# Patient Record
Sex: Female | Born: 1985 | Race: Black or African American | Hispanic: No | Marital: Single | Smoking: Never smoker
Health system: Southern US, Community
[De-identification: ages and names within clinical notes are randomized; demographics above are authoritative.]

---
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
## 2006-03-02 ENCOUNTER — Emergency Department: Payer: Self-pay | Admitting: Emergency Medicine

## 2006-03-05 ENCOUNTER — Emergency Department: Payer: Self-pay

## 2009-03-03 ENCOUNTER — Emergency Department: Payer: Self-pay | Admitting: Emergency Medicine

## 2010-05-01 ENCOUNTER — Emergency Department: Payer: Self-pay | Admitting: Internal Medicine

## 2010-05-07 ENCOUNTER — Emergency Department: Payer: Self-pay | Admitting: Emergency Medicine

## 2010-05-16 ENCOUNTER — Emergency Department: Payer: Self-pay | Admitting: Emergency Medicine

## 2010-05-23 ENCOUNTER — Inpatient Hospital Stay: Payer: Self-pay | Admitting: Obstetrics and Gynecology

## 2010-06-06 ENCOUNTER — Observation Stay: Payer: Self-pay

## 2010-06-12 ENCOUNTER — Inpatient Hospital Stay: Payer: Self-pay

## 2010-11-11 ENCOUNTER — Observation Stay: Payer: Self-pay

## 2010-11-30 ENCOUNTER — Observation Stay: Payer: Self-pay | Admitting: Obstetrics and Gynecology

## 2010-12-06 ENCOUNTER — Inpatient Hospital Stay: Payer: Self-pay | Admitting: Obstetrics and Gynecology

## 2016-06-17 ENCOUNTER — Encounter: Payer: Self-pay | Admitting: Emergency Medicine

## 2016-06-17 ENCOUNTER — Emergency Department
Admission: EM | Admit: 2016-06-17 | Discharge: 2016-06-17 | Disposition: A | Discharge status: Home / Self Care | Payer: Commercial Managed Care - PPO | Attending: Student in an Organized Health Care Education/Training Program | Admitting: Student in an Organized Health Care Education/Training Program

## 2016-06-17 ENCOUNTER — Emergency Department: Payer: Commercial Managed Care - PPO

## 2016-06-17 DIAGNOSIS — R0602 Shortness of breath: Secondary | ICD-10-CM | POA: Insufficient documentation

## 2016-06-17 DIAGNOSIS — R079 Chest pain, unspecified: Secondary | ICD-10-CM | POA: Insufficient documentation

## 2016-06-17 LAB — BASIC METABOLIC PANEL
Anion gap: 7 (ref 5–15)
BUN: 12 mg/dL (ref 6–20)
CHLORIDE: 105 mmol/L (ref 101–111)
CO2: 27 mmol/L (ref 22–32)
CREATININE: 0.96 mg/dL (ref 0.44–1.00)
Calcium: 9.5 mg/dL (ref 8.9–10.3)
Glucose, Bld: 89 mg/dL (ref 65–99)
POTASSIUM: 3.9 mmol/L (ref 3.5–5.1)
SODIUM: 139 mmol/L (ref 135–145)

## 2016-06-17 LAB — CBC
HCT: 38.8 % (ref 35.0–47.0)
Hemoglobin: 13.3 g/dL (ref 12.0–16.0)
MCH: 31 pg (ref 26.0–34.0)
MCHC: 34.4 g/dL (ref 32.0–36.0)
MCV: 90.1 fL (ref 80.0–100.0)
PLATELETS: 217 10*3/uL (ref 150–440)
RBC: 4.3 MIL/uL (ref 3.80–5.20)
RDW: 12.7 % (ref 11.5–14.5)
WBC: 8.4 10*3/uL (ref 3.6–11.0)

## 2016-06-17 LAB — TROPONIN I: Troponin I: <0.03 ng/mL (ref <0.03)

## 2016-06-17 LAB — FIBRIN DERIVATIVES D-DIMER (ARMC ONLY)

## 2016-06-17 NOTE — ED Notes (Signed)
Signature pad did not capture signature.  Discharge instructions reviewed.  Patient verbalized understanding and has no further questions.

## 2016-06-17 NOTE — ED Triage Notes (Signed)
Pt reports left sided chest pain x3 days that radiates to her neck, reports some intermittent shortness of breath.

## 2016-06-17 NOTE — ED Provider Notes (Signed)
[facility] Emergency Department Provider Note    None    (approximate)  I have reviewed the triage vital signs and the nursing notes.   HISTORY  Chief Complaint Chest Pain and Shortness of Breath    HPI Jessica [name] is a 31 y.o. female who presents with left-sided chest pain that radiates up into her neck with achiness in her left chest wall. States that this has been associated with some intermittent shortness of breath. States that she woke up this morning with persistent discomfort and became concerned so she came to the ER for further evaluation. She is on appetite suppressant medication and is on birth control. States that she does feel that she's getting palpitations. No recent fevers. No cough. Was recently sick with GI bug. No change in the pain with positioning. No orthopnea. No lower extremity swelling.   History reviewed. No pertinent past medical history. No family history on file. No past surgical history on file. There are no active problems to display for this patient.     Prior to Admission medications   Not on File    Allergies Patient has no known allergies.    Social History Social History  Substance Use Topics  . Smoking status: Not on file  . Smokeless tobacco: Not on file  . Alcohol use Not on file    Review of Systems Patient denies headaches, rhinorrhea, blurry vision, numbness, shortness of breath, chest pain, edema, cough, abdominal pain, nausea, vomiting, diarrhea, dysuria, fevers, rashes or hallucinations unless otherwise stated above in HPI. ____________________________________________   PHYSICAL EXAM:  VITAL SIGNS: Vitals:   06/17/16 1126 06/17/16 1522  BP: (!) 141/88 124/82  Pulse: 96 74  Resp: 15 18  Temp: 98.4 F (36.9 C)     Constitutional: Alert and oriented. Well appearing and in no acute distress. Eyes: Conjunctivae are normal. PERRL. EOMI. Head: Atraumatic. Nose: No  congestion/rhinnorhea. Mouth/Throat: Mucous membranes are moist.  Oropharynx non-erythematous. Neck: No stridor. Painless ROM. No cervical spine tenderness to palpation Hematological/Lymphatic/Immunilogical: No cervical lymphadenopathy. Cardiovascular: Normal rate, regular rhythm. Grossly normal heart sounds.  Good peripheral circulation. Respiratory: Normal respiratory effort.  No retractions. Lungs CTAB. Gastrointestinal: Soft and nontender. No distention. No abdominal bruits. No CVA tenderness. Genitourinary:  Musculoskeletal: No lower extremity tenderness nor edema.  No joint effusions. Neurologic:  Normal speech and language. No gross focal neurologic deficits are appreciated. No gait instability. Skin:  Skin is warm, dry and intact. No rash noted. Psychiatric: Mood and affect are normal. Speech and behavior are normal.  ____________________________________________   LABS (all labs ordered are listed, but only abnormal results are displayed)  Results for orders placed or performed during the hospital encounter of 06/17/16 (from the past 24 hour(s))  Basic metabolic panel     Status: None   Collection Time: 06/17/16 11:22 AM  Result Value Ref Range   Sodium 139 135 - 145 mmol/L   Potassium 3.9 3.5 - 5.1 mmol/L   Chloride 105 101 - 111 mmol/L   CO2 27 22 - 32 mmol/L   Glucose, Bld 89 65 - 99 mg/dL   BUN 12 6 - 20 mg/dL   Creatinine, Ser 1.61 0.44 - 1.00 mg/dL   Calcium 9.5 8.9 - 09.6 mg/dL   GFR calc non Af Amer >60 >60 mL/min   GFR calc Af Amer >60 >60 mL/min   Anion gap 7 5 - 15  CBC     Status: None   Collection Time:  06/17/16 11:22 AM  Result Value Ref Range   WBC 8.4 3.6 - 11.0 K/uL   RBC 4.30 3.80 - 5.20 MIL/uL   Hemoglobin 13.3 12.0 - 16.0 g/dL   HCT 32.4 40.1 - 02.7 %   MCV 90.1 80.0 - 100.0 fL   MCH 31.0 26.0 - 34.0 pg   MCHC 34.4 32.0 - 36.0 g/dL   RDW 25.3 66.4 - 40.3 %   Platelets 217 150 - 440 K/uL  Troponin I     Status: None   Collection Time: 06/17/16  11:22 AM  Result Value Ref Range   Troponin I <0.03 <0.03 ng/mL  Troponin I     Status: None   Collection Time: 06/17/16  3:01 PM  Result Value Ref Range   Troponin I <0.03 <0.03 ng/mL  Fibrin derivatives D-Dimer ([facility] only)     Status: None   Collection Time: 06/17/16  3:01 PM  Result Value Ref Range   Fibrin derivatives D-dimer ([facility]) <45.00 0.00 - 499.00   ____________________________________________  EKG My review and personal interpretation at Time: 11:22   Indication: chest pain  Rate: 95  Rhythm: sinus Axis: normal Other: normal intervals, no st elevations or depressions ____________________________________________  RADIOLOGY  I personally reviewed all radiographic images ordered to evaluate for the above acute complaints and reviewed radiology reports and findings.  These findings were personally discussed with the patient.  Please see medical record for radiology report.  ____________________________________________   PROCEDURES  Procedure(s) performed:  Procedures    Critical Care performed: no ____________________________________________   INITIAL IMPRESSION / ASSESSMENT AND PLAN / ED COURSE  Pertinent labs & imaging results that were available during my care of the patient were reviewed by me and considered in my medical decision making (see chart for details).  DDX: ACS, pericarditis, bronchitis, costochondritis, pna, Pe, dissection   Jessica [name] is a 31 y.o. who presents to the ED with chest pain as described above.  Patient is AFVSS in ED. Exam as above. Given current presentation have considered the above differential.  Patient is low risk by well's for further risk stratify for PE with d-dimer. EKG shows no dysrhythmia or acute ischemia. Given recent viral illness could have a component of pericarditis but clinically does not appear so. Chest x-ray shows no evidence of consolidation or pneumothorax. No clinical evidence to suggest dissection or aneurysm  and this is a well-appearing young patient with no significant comorbidities.  The patient will be placed on continuous pulse oximetry and telemetry for monitoring.  Laboratory evaluation will be sent to evaluate for the above complaints.      Clinical Course as of Jun 17 1553  Mon Jun 17, 2016  1552 Repeat troponin is negative. D-dimer is negative. Do suspect some component of costochondritis or pleurisy. Patient well-appearing and in no acute distress. Patient stable for follow-up with her PCP.  Have discussed with the patient and available family all diagnostics and treatments performed thus far and all questions were answered to the best of my ability. The patient demonstrates understanding and agreement with plan.   [PR]    Clinical Course User Index [PR] Willy Eddy, MD     ____________________________________________   FINAL CLINICAL IMPRESSION(S) / ED DIAGNOSES  Final diagnoses:  Chest pain, unspecified type      NEW MEDICATIONS STARTED DURING THIS VISIT:  New Prescriptions   No medications on file     Note:  This document was prepared using Dragon voice recognition software and  may include unintentional dictation errors.    Willy Eddyobinson, Afrah Burlison, MD 06/17/16 [id number]

## 2017-01-21 ENCOUNTER — Encounter (HOSPITAL_COMMUNITY)
Admission: EM | Disposition: A | Discharge status: Home Health | Payer: Self-pay | Source: Home / Self Care | Attending: Orthopedic Surgery

## 2017-01-21 ENCOUNTER — Other Ambulatory Visit: Payer: Self-pay

## 2017-01-21 ENCOUNTER — Inpatient Hospital Stay (HOSPITAL_COMMUNITY): Payer: Commercial Managed Care - PPO | Admitting: Certified Registered Nurse Anesthetist

## 2017-01-21 ENCOUNTER — Inpatient Hospital Stay (HOSPITAL_COMMUNITY)
Admission: EM | Admit: 2017-01-21 | Discharge: 2017-01-24 | DRG: 494 | Disposition: A | Discharge status: Home Health | Payer: Commercial Managed Care - PPO | Attending: Orthopedic Surgery | Admitting: Orthopedic Surgery

## 2017-01-21 ENCOUNTER — Inpatient Hospital Stay (HOSPITAL_COMMUNITY): Payer: Commercial Managed Care - PPO

## 2017-01-21 ENCOUNTER — Encounter (HOSPITAL_COMMUNITY): Payer: Self-pay | Admitting: Emergency Medicine

## 2017-01-21 ENCOUNTER — Emergency Department (HOSPITAL_COMMUNITY): Payer: Commercial Managed Care - PPO

## 2017-01-21 DIAGNOSIS — M79642 Pain in left hand: Secondary | ICD-10-CM | POA: Diagnosis present

## 2017-01-21 DIAGNOSIS — S82251A Displaced comminuted fracture of shaft of right tibia, initial encounter for closed fracture: Secondary | ICD-10-CM | POA: Diagnosis present

## 2017-01-21 DIAGNOSIS — S82401A Unspecified fracture of shaft of right fibula, initial encounter for closed fracture: Secondary | ICD-10-CM | POA: Diagnosis not present

## 2017-01-21 DIAGNOSIS — M25552 Pain in left hip: Secondary | ICD-10-CM | POA: Diagnosis present

## 2017-01-21 DIAGNOSIS — S42022D Displaced fracture of shaft of left clavicle, subsequent encounter for fracture with routine healing: Secondary | ICD-10-CM | POA: Diagnosis not present

## 2017-01-21 DIAGNOSIS — Z419 Encounter for procedure for purposes other than remedying health state, unspecified: Secondary | ICD-10-CM

## 2017-01-21 DIAGNOSIS — R52 Pain, unspecified: Secondary | ICD-10-CM

## 2017-01-21 DIAGNOSIS — S82251D Displaced comminuted fracture of shaft of right tibia, subsequent encounter for closed fracture with routine healing: Secondary | ICD-10-CM | POA: Diagnosis not present

## 2017-01-21 DIAGNOSIS — Z23 Encounter for immunization: Secondary | ICD-10-CM | POA: Diagnosis not present

## 2017-01-21 DIAGNOSIS — Y92410 Unspecified street and highway as the place of occurrence of the external cause: Secondary | ICD-10-CM

## 2017-01-21 DIAGNOSIS — S42022A Displaced fracture of shaft of left clavicle, initial encounter for closed fracture: Secondary | ICD-10-CM | POA: Diagnosis present

## 2017-01-21 DIAGNOSIS — S42002A Fracture of unspecified part of left clavicle, initial encounter for closed fracture: Secondary | ICD-10-CM | POA: Diagnosis not present

## 2017-01-21 DIAGNOSIS — S82451A Displaced comminuted fracture of shaft of right fibula, initial encounter for closed fracture: Secondary | ICD-10-CM | POA: Diagnosis present

## 2017-01-21 DIAGNOSIS — S82201A Unspecified fracture of shaft of right tibia, initial encounter for closed fracture: Secondary | ICD-10-CM

## 2017-01-21 HISTORY — PX: IM NAILING TIBIA: SUR734

## 2017-01-21 HISTORY — PX: TIBIA IM NAIL INSERTION: SHX2516

## 2017-01-21 HISTORY — PX: ORIF CLAVICLE FRACTURE: SUR924

## 2017-01-21 HISTORY — PX: ORIF CLAVICULAR FRACTURE: SHX5055

## 2017-01-21 LAB — COMPREHENSIVE METABOLIC PANEL
ALT: 16 U/L (ref 14–54)
AST: 24 U/L (ref 15–41)
Albumin: 4 g/dL (ref 3.5–5.0)
Alkaline Phosphatase: 54 U/L (ref 38–126)
Anion gap: 9 (ref 5–15)
BILIRUBIN TOTAL: 0.8 mg/dL (ref 0.3–1.2)
BUN: 11 mg/dL (ref 6–20)
CHLORIDE: 106 mmol/L (ref 101–111)
CO2: 24 mmol/L (ref 22–32)
CREATININE: 0.8 mg/dL (ref 0.44–1.00)
Calcium: 8.9 mg/dL (ref 8.9–10.3)
GFR calc Af Amer: >60 mL/min (ref >60)
GFR calc non Af Amer: >60 mL/min (ref >60)
GLUCOSE: 125 mg/dL — AB (ref 65–99)
Potassium: 3 mmol/L — ABNORMAL LOW (ref 3.5–5.1)
Sodium: 139 mmol/L (ref 135–145)
Total Protein: 7 g/dL (ref 6.5–8.1)

## 2017-01-21 LAB — RAPID URINE DRUG SCREEN, HOSP PERFORMED
Amphetamines: NOT DETECTED
BARBITURATES: NOT DETECTED
Benzodiazepines: NOT DETECTED
Cocaine: NOT DETECTED
Opiates: NOT DETECTED
Tetrahydrocannabinol: NOT DETECTED

## 2017-01-21 LAB — I-STAT BETA HCG BLOOD, ED (MC, WL, AP ONLY): I-stat hCG, quantitative: <5 m[IU]/mL (ref <5)

## 2017-01-21 LAB — CBC WITH DIFFERENTIAL/PLATELET
BASOS ABS: 0 10*3/uL (ref 0.0–0.1)
Basophils Relative: 0 %
Eosinophils Absolute: 0.1 10*3/uL (ref 0.0–0.7)
Eosinophils Relative: 1 %
HEMATOCRIT: 37.6 % (ref 36.0–46.0)
Hemoglobin: 12.9 g/dL (ref 12.0–15.0)
LYMPHS PCT: 19 %
Lymphs Abs: 2.9 10*3/uL (ref 0.7–4.0)
MCH: 31.2 pg (ref 26.0–34.0)
MCHC: 34.3 g/dL (ref 30.0–36.0)
MCV: 90.8 fL (ref 78.0–100.0)
Monocytes Absolute: 0.8 10*3/uL (ref 0.1–1.0)
Monocytes Relative: 5 %
NEUTROS ABS: 11.5 10*3/uL — AB (ref 1.7–7.7)
Neutrophils Relative %: 75 %
PLATELETS: 261 10*3/uL (ref 150–400)
RBC: 4.14 MIL/uL (ref 3.87–5.11)
RDW: 13.3 % (ref 11.5–15.5)
WBC: 15.2 10*3/uL — AB (ref 4.0–10.5)

## 2017-01-21 LAB — SURGICAL PCR SCREEN
MRSA, PCR: NEGATIVE
Staphylococcus aureus: NEGATIVE

## 2017-01-21 SURGERY — OPEN REDUCTION INTERNAL FIXATION (ORIF) CLAVICULAR FRACTURE
Anesthesia: General | Laterality: Right

## 2017-01-21 MED ORDER — METOCLOPRAMIDE HCL 5 MG PO TABS: 5.0000 mg | ORAL_TABLET | Freq: Three times a day (TID) | ORAL | Status: DC | PRN

## 2017-01-21 MED ORDER — PROMETHAZINE HCL 25 MG/ML IJ SOLN: 6.2500 mg | INTRAMUSCULAR | Status: DC | PRN

## 2017-01-21 MED ORDER — HYDROMORPHONE HCL 1 MG/ML IJ SOLN
INTRAMUSCULAR | Status: AC
  Filled 2017-01-21: qty 1

## 2017-01-21 MED ORDER — 0.9 % SODIUM CHLORIDE (POUR BTL) OPTIME
TOPICAL | Status: DC | PRN
  Administered 2017-01-21: 1000 mL

## 2017-01-21 MED ORDER — FENTANYL CITRATE (PF) 100 MCG/2ML IJ SOLN
100.0000 ug | Freq: Once | INTRAMUSCULAR | Status: AC
  Administered 2017-01-21: 100 ug via INTRAVENOUS

## 2017-01-21 MED ORDER — SODIUM CHLORIDE 0.9 % IV SOLN
INTRAVENOUS | Status: AC
  Administered 2017-01-21: 03:00:00 via INTRAVENOUS

## 2017-01-21 MED ORDER — ONDANSETRON HCL 4 MG PO TABS: 4.0000 mg | ORAL_TABLET | Freq: Four times a day (QID) | ORAL | Status: DC | PRN

## 2017-01-21 MED ORDER — DEXTROSE 5 % IV SOLN
INTRAVENOUS | Status: DC | PRN
  Administered 2017-01-21: 3 g via INTRAVENOUS

## 2017-01-21 MED ORDER — MIDAZOLAM HCL 2 MG/2ML IJ SOLN
INTRAMUSCULAR | Status: AC
  Filled 2017-01-21: qty 2

## 2017-01-21 MED ORDER — FENTANYL CITRATE (PF) 250 MCG/5ML IJ SOLN
INTRAMUSCULAR | Status: AC
Start: 2017-01-21 — End: 2017-01-21
  Filled 2017-01-21: qty 5

## 2017-01-21 MED ORDER — POLYETHYLENE GLYCOL 3350 17 G PO PACK: 17.0000 g | PACK | Freq: Every day | ORAL | Status: DC | PRN

## 2017-01-21 MED ORDER — DEXAMETHASONE SODIUM PHOSPHATE 10 MG/ML IJ SOLN
INTRAMUSCULAR | Status: AC
  Filled 2017-01-21: qty 1

## 2017-01-21 MED ORDER — FENTANYL CITRATE (PF) 100 MCG/2ML IJ SOLN
50.0000 ug | Freq: Once | INTRAMUSCULAR | Status: AC
  Administered 2017-01-21: 50 ug via INTRAVENOUS

## 2017-01-21 MED ORDER — METOCLOPRAMIDE HCL 5 MG/ML IJ SOLN: 5.0000 mg | Freq: Three times a day (TID) | INTRAMUSCULAR | Status: DC | PRN

## 2017-01-21 MED ORDER — ONDANSETRON HCL 4 MG/2ML IJ SOLN: 4.0000 mg | Freq: Three times a day (TID) | INTRAMUSCULAR | Status: DC | PRN

## 2017-01-21 MED ORDER — LACTATED RINGERS IV SOLN
INTRAVENOUS | Status: DC | PRN
  Administered 2017-01-21 (×2): via INTRAVENOUS

## 2017-01-21 MED ORDER — FENTANYL CITRATE (PF) 100 MCG/2ML IJ SOLN
50.0000 ug | Freq: Once | INTRAMUSCULAR | Status: AC
  Administered 2017-01-21: 50 ug via INTRAVENOUS
  Filled 2017-01-21: qty 2

## 2017-01-21 MED ORDER — DEXAMETHASONE SODIUM PHOSPHATE 10 MG/ML IJ SOLN
INTRAMUSCULAR | Status: DC | PRN
  Administered 2017-01-21: 10 mg via INTRAVENOUS

## 2017-01-21 MED ORDER — FENTANYL CITRATE (PF) 100 MCG/2ML IJ SOLN
50.0000 ug | INTRAMUSCULAR | Status: DC | PRN
  Administered 2017-01-21 (×4): 50 ug via INTRAVENOUS
  Filled 2017-01-21 (×4): qty 2

## 2017-01-21 MED ORDER — CHLORHEXIDINE GLUCONATE 4 % EX LIQD: 60.0000 mL | Freq: Once | CUTANEOUS | Status: DC

## 2017-01-21 MED ORDER — PHENYLEPHRINE HCL 10 MG/ML IJ SOLN
INTRAMUSCULAR | Status: DC | PRN
  Administered 2017-01-21 (×3): 80 ug via INTRAVENOUS

## 2017-01-21 MED ORDER — ACETAMINOPHEN 325 MG PO TABS
650.0000 mg | ORAL_TABLET | ORAL | Status: DC | PRN
  Administered 2017-01-21 – 2017-01-22 (×2): 650 mg via ORAL
  Filled 2017-01-21 (×2): qty 2

## 2017-01-21 MED ORDER — DOCUSATE SODIUM 100 MG PO CAPS
100.0000 mg | ORAL_CAPSULE | Freq: Two times a day (BID) | ORAL | Status: DC
  Administered 2017-01-21 – 2017-01-24 (×6): 100 mg via ORAL
  Filled 2017-01-21 (×6): qty 1

## 2017-01-21 MED ORDER — HYDROMORPHONE HCL 1 MG/ML IJ SOLN
1.0000 mg | INTRAMUSCULAR | Status: DC | PRN
  Administered 2017-01-21 (×2): 1 mg via INTRAVENOUS
  Filled 2017-01-21 (×2): qty 1

## 2017-01-21 MED ORDER — MAGNESIUM CITRATE PO SOLN: 1.0000 | Freq: Once | ORAL | Status: DC | PRN

## 2017-01-21 MED ORDER — CEFAZOLIN SODIUM-DEXTROSE 2-4 GM/100ML-% IV SOLN
2.0000 g | Freq: Four times a day (QID) | INTRAVENOUS | Status: AC
  Administered 2017-01-21 – 2017-01-22 (×3): 2 g via INTRAVENOUS
  Filled 2017-01-21 (×3): qty 100

## 2017-01-21 MED ORDER — METHOCARBAMOL 1000 MG/10ML IJ SOLN
500.0000 mg | Freq: Four times a day (QID) | INTRAMUSCULAR | Status: DC | PRN
  Filled 2017-01-21: qty 5

## 2017-01-21 MED ORDER — ASPIRIN EC 325 MG PO TBEC
325.0000 mg | DELAYED_RELEASE_TABLET | Freq: Every day | ORAL | Status: DC
  Administered 2017-01-22 – 2017-01-24 (×3): 325 mg via ORAL
  Filled 2017-01-21 (×3): qty 1

## 2017-01-21 MED ORDER — SUGAMMADEX SODIUM 200 MG/2ML IV SOLN
INTRAVENOUS | Status: DC | PRN
  Administered 2017-01-21: 175 mg via INTRAVENOUS

## 2017-01-21 MED ORDER — OXYCODONE HCL 5 MG PO TABS
5.0000 mg | ORAL_TABLET | ORAL | Status: DC | PRN
  Administered 2017-01-23: 5 mg via ORAL
  Filled 2017-01-21: qty 1

## 2017-01-21 MED ORDER — ROCURONIUM BROMIDE 10 MG/ML (PF) SYRINGE
PREFILLED_SYRINGE | INTRAVENOUS | Status: AC
Start: 2017-01-21 — End: 2017-01-21
  Filled 2017-01-21: qty 5

## 2017-01-21 MED ORDER — TETANUS-DIPHTH-ACELL PERTUSSIS 5-2.5-18.5 LF-MCG/0.5 IM SUSP
INTRAMUSCULAR | Status: AC
Start: 2017-01-21 — End: 2017-01-21
  Filled 2017-01-21: qty 0.5

## 2017-01-21 MED ORDER — ROCURONIUM BROMIDE 100 MG/10ML IV SOLN
INTRAVENOUS | Status: DC | PRN
  Administered 2017-01-21: 50 mg via INTRAVENOUS

## 2017-01-21 MED ORDER — FENTANYL CITRATE (PF) 100 MCG/2ML IJ SOLN
INTRAMUSCULAR | Status: AC
  Filled 2017-01-21: qty 2

## 2017-01-21 MED ORDER — OXYCODONE HCL 5 MG PO TABS
10.0000 mg | ORAL_TABLET | ORAL | Status: DC | PRN
  Administered 2017-01-21 – 2017-01-24 (×10): 10 mg via ORAL
  Filled 2017-01-21 (×11): qty 2

## 2017-01-21 MED ORDER — TETANUS-DIPHTH-ACELL PERTUSSIS 5-2.5-18.5 LF-MCG/0.5 IM SUSP
0.5000 mL | Freq: Once | INTRAMUSCULAR | Status: AC
  Administered 2017-01-21: 0.5 mL via INTRAMUSCULAR

## 2017-01-21 MED ORDER — CEFAZOLIN SODIUM-DEXTROSE 2-4 GM/100ML-% IV SOLN: 2.0000 g | INTRAVENOUS | Status: DC

## 2017-01-21 MED ORDER — ONDANSETRON HCL 4 MG/2ML IJ SOLN
4.0000 mg | Freq: Four times a day (QID) | INTRAMUSCULAR | Status: DC | PRN
  Administered 2017-01-21: 4 mg via INTRAVENOUS
  Filled 2017-01-21: qty 2

## 2017-01-21 MED ORDER — SODIUM CHLORIDE 0.9 % IV SOLN
INTRAVENOUS | Status: DC
  Administered 2017-01-21: 17:00:00 via INTRAVENOUS

## 2017-01-21 MED ORDER — POVIDONE-IODINE 10 % EX SWAB: 2.0000 "application " | Freq: Once | CUTANEOUS | Status: DC

## 2017-01-21 MED ORDER — BISACODYL 10 MG RE SUPP: 10.0000 mg | Freq: Every day | RECTAL | Status: DC | PRN

## 2017-01-21 MED ORDER — LIDOCAINE 2% (20 MG/ML) 5 ML SYRINGE
INTRAMUSCULAR | Status: AC
  Filled 2017-01-21: qty 5

## 2017-01-21 MED ORDER — ACETAMINOPHEN 650 MG RE SUPP: 650.0000 mg | RECTAL | Status: DC | PRN

## 2017-01-21 MED ORDER — LIDOCAINE HCL (CARDIAC) 20 MG/ML IV SOLN
INTRAVENOUS | Status: DC | PRN
  Administered 2017-01-21: 50 mg via INTRAVENOUS

## 2017-01-21 MED ORDER — SODIUM CHLORIDE 0.9 % IV BOLUS (SEPSIS)
1000.0000 mL | Freq: Once | INTRAVENOUS | Status: AC
  Administered 2017-01-21: 1000 mL via INTRAVENOUS

## 2017-01-21 MED ORDER — MIDAZOLAM HCL 5 MG/5ML IJ SOLN
INTRAMUSCULAR | Status: DC | PRN
  Administered 2017-01-21: 1 mg via INTRAVENOUS

## 2017-01-21 MED ORDER — HYDROMORPHONE HCL 1 MG/ML IJ SOLN
0.2500 mg | INTRAMUSCULAR | Status: DC | PRN
  Administered 2017-01-21 (×2): 0.5 mg via INTRAVENOUS

## 2017-01-21 MED ORDER — SUGAMMADEX SODIUM 200 MG/2ML IV SOLN
INTRAVENOUS | Status: AC
  Filled 2017-01-21: qty 2

## 2017-01-21 MED ORDER — PROPOFOL 10 MG/ML IV BOLUS
INTRAVENOUS | Status: DC | PRN
  Administered 2017-01-21: 100 mg via INTRAVENOUS

## 2017-01-21 MED ORDER — METHOCARBAMOL 500 MG PO TABS
500.0000 mg | ORAL_TABLET | Freq: Four times a day (QID) | ORAL | Status: DC | PRN
  Administered 2017-01-21 – 2017-01-23 (×4): 500 mg via ORAL
  Filled 2017-01-21 (×4): qty 1

## 2017-01-21 MED ORDER — FENTANYL CITRATE (PF) 100 MCG/2ML IJ SOLN
INTRAMUSCULAR | Status: DC | PRN
  Administered 2017-01-21: 50 ug via INTRAVENOUS
  Administered 2017-01-21 (×2): 100 ug via INTRAVENOUS

## 2017-01-21 MED ORDER — ONDANSETRON HCL 4 MG/2ML IJ SOLN
INTRAMUSCULAR | Status: AC
  Filled 2017-01-21: qty 2

## 2017-01-21 MED ORDER — PHENYLEPHRINE 40 MCG/ML (10ML) SYRINGE FOR IV PUSH (FOR BLOOD PRESSURE SUPPORT)
PREFILLED_SYRINGE | INTRAVENOUS | Status: AC
  Filled 2017-01-21: qty 10

## 2017-01-21 SURGICAL SUPPLY — 63 items
BANDAGE ACE 6X5 VEL STRL LF (GAUZE/BANDAGES/DRESSINGS) ×4 IMPLANT
BIT DRILL 2.5X110 QC LCP DISP (BIT) ×4 IMPLANT
BIT DRILL CALIBRATED 4.2 (BIT) ×2 IMPLANT
BIT DRILL SHORT 4.2 (BIT) ×2 IMPLANT
BLADE SURG 10 STRL SS (BLADE) ×4 IMPLANT
BNDG COHESIVE 4X5 TAN STRL (GAUZE/BANDAGES/DRESSINGS) ×4 IMPLANT
BNDG COHESIVE 6X5 TAN STRL LF (GAUZE/BANDAGES/DRESSINGS) ×4 IMPLANT
BNDG GAUZE ELAST 4 BULKY (GAUZE/BANDAGES/DRESSINGS) ×4 IMPLANT
CLOSURE WOUND 1/4X4 (GAUZE/BANDAGES/DRESSINGS) ×1
COVER SURGICAL LIGHT HANDLE (MISCELLANEOUS) ×4 IMPLANT
DRAPE C-ARM MINI 42X72 WSTRAPS (DRAPES) ×4 IMPLANT
DRAPE IMP U-DRAPE 54X76 (DRAPES) ×4 IMPLANT
DRAPE ORTHO SPLIT 77X108 STRL (DRAPES) ×2
DRAPE SURG ORHT 6 SPLT 77X108 (DRAPES) ×2 IMPLANT
DRAPE U-SHAPE 47X51 STRL (DRAPES) ×8 IMPLANT
DRILL BIT CALIBRATED 4.2 (BIT) ×4
DRILL BIT SHORT 4.2 (BIT) ×2
DRSG ADAPTIC 3X8 NADH LF (GAUZE/BANDAGES/DRESSINGS) ×4 IMPLANT
DRSG MEPILEX BORDER 4X4 (GAUZE/BANDAGES/DRESSINGS) ×16 IMPLANT
DRSG MEPILEX BORDER 4X8 (GAUZE/BANDAGES/DRESSINGS) ×4 IMPLANT
DRSG PAD ABDOMINAL 8X10 ST (GAUZE/BANDAGES/DRESSINGS) ×8 IMPLANT
DURAPREP 26ML APPLICATOR (WOUND CARE) ×8 IMPLANT
ELECT CAUTERY BLADE 6.4 (BLADE) ×4 IMPLANT
ELECT REM PT RETURN 9FT ADLT (ELECTROSURGICAL) ×4
ELECTRODE REM PT RTRN 9FT ADLT (ELECTROSURGICAL) ×2 IMPLANT
GAUZE SPONGE 4X4 12PLY STRL (GAUZE/BANDAGES/DRESSINGS) ×4 IMPLANT
GLOVE BIO SURGEON ST LM GN SZ9 (GLOVE) ×8 IMPLANT
GLOVE BIO SURGEON STRL SZ8 (GLOVE) ×4 IMPLANT
GLOVE BIOGEL PI IND STRL 9 (GLOVE) ×2 IMPLANT
GLOVE BIOGEL PI INDICATOR 9 (GLOVE) ×2
GLOVE SURG ORTHO 9.0 STRL STRW (GLOVE) ×4 IMPLANT
GOWN STRL REUS W/ TWL XL LVL3 (GOWN DISPOSABLE) ×6 IMPLANT
GOWN STRL REUS W/TWL XL LVL3 (GOWN DISPOSABLE) ×6
GUIDEWIRE 3.2X400 (WIRE) ×4 IMPLANT
KIT BASIN OR (CUSTOM PROCEDURE TRAY) ×4 IMPLANT
KIT ROOM TURNOVER OR (KITS) ×4 IMPLANT
NAIL TIBIAL CANN 10MM PROX 330 (Nail) ×4 IMPLANT
PACK ORTHO EXTREMITY (CUSTOM PROCEDURE TRAY) ×4 IMPLANT
PACK SHOULDER (CUSTOM PROCEDURE TRAY) ×4 IMPLANT
PACK UNIVERSAL I (CUSTOM PROCEDURE TRAY) ×4 IMPLANT
PAD ARMBOARD 7.5X6 YLW CONV (MISCELLANEOUS) ×8 IMPLANT
PENCIL BUTTON HOLSTER BLD 10FT (ELECTRODE) ×4 IMPLANT
PLATE CLAVICLE 3.5X85 6H LFT (Plate) ×4 IMPLANT
REAMER ROD DEEP FLUTE 2.5X950 (INSTRUMENTS) ×4 IMPLANT
SCREW CORTEX 3.5 14MM (Screw) ×8 IMPLANT
SCREW LOCK CORT ST 3.5X14 (Screw) ×8 IMPLANT
SCREW LOCK STAR 5X28 (Screw) ×8 IMPLANT
SCREW LOCK STAR 5X50 (Screw) ×4 IMPLANT
SPONGE LAP 18X18 X RAY DECT (DISPOSABLE) ×4 IMPLANT
STAPLER VISISTAT 35W (STAPLE) ×4 IMPLANT
STOCKINETTE IMPERVIOUS LG (DRAPES) ×4 IMPLANT
STRIP CLOSURE SKIN 1/4X4 (GAUZE/BANDAGES/DRESSINGS) ×3 IMPLANT
SUCTION FRAZIER HANDLE 10FR (MISCELLANEOUS) ×2
SUCTION TUBE FRAZIER 10FR DISP (MISCELLANEOUS) ×2 IMPLANT
SUT VIC AB 0 CTB1 27 (SUTURE) ×4 IMPLANT
SUT VIC AB 2-0 CT1 27 (SUTURE) ×2
SUT VIC AB 2-0 CT1 TAPERPNT 27 (SUTURE) ×2 IMPLANT
SUT VIC AB 2-0 CTB1 (SUTURE) ×4 IMPLANT
SYR BULB IRRIGATION 50ML (SYRINGE) ×4 IMPLANT
TOWEL OR 17X24 6PK STRL BLUE (TOWEL DISPOSABLE) ×4 IMPLANT
TOWEL OR 17X26 10 PK STRL BLUE (TOWEL DISPOSABLE) ×4 IMPLANT
TUBE CONNECTING 12'X1/4 (SUCTIONS) ×1
TUBE CONNECTING 12X1/4 (SUCTIONS) ×3 IMPLANT

## 2017-01-21 NOTE — Op Note (Signed)
01/21/2017  12:02 PM  PATIENT:  Jessica [name]    PRE-OPERATIVE DIAGNOSIS:  Fracture Right Tibia and Left Clavicle closed.  POST-OPERATIVE DIAGNOSIS:  Same  PROCEDURE:  OPEN REDUCTION INTERNAL FIXATION (ORIF) CLAVICULAR FRACTURE, INTRAMEDULLARY (IM) NAIL TIBIAL  SURGEON:  Nadara MustardMarcus V Duda, MD  PHYSICIAN ASSISTANT:None ANESTHESIA:   General  PREOPERATIVE INDICATIONS:  Jessica [name] is a  32 y.o. female with a diagnosis of Fracture Right Tibia and Left Clavicle  who failed conservative measures and elected for surgical management.    The risks benefits and alternatives were discussed with the patient preoperatively including but not limited to the risks of infection, bleeding, nerve injury, cardiopulmonary complications, the need for revision surgery, among others, and the patient was willing to proceed.  OPERATIVE IMPLANTS: Synthes 10 x 330 mm tibial nail locked proximally x1 distally x2. Synthes 6-hole stainless steel clavicle plate.  OPERATIVE FINDINGS: Stable alignment AP and lateral planes postoperatively.  OPERATIVE PROCEDURE: Patient was brought the operating room and underwent a general anesthetic.  After adequate levels of anesthesia were obtained patient's right lower extremity and left upper extremity were prepped using DuraPrep draped into a sterile field a timeout was called.  A skin incision was made medial and mid patella for the right [name] with the [name] on the 30 degree triangle.  A guidewire was inserted center center into the tibia this was inserted into the bone C-arm fluoroscopy verified alignment this was then overreamed.  A guidewire was then advanced across the segmental segments of the tibia.  This was then reamed sequentially to 11.5 mm for a 10 mm nail.  The nail was inserted.  It was locked transversely medially to laterally proximally and distally using freehand technique and the C-arm fluoroscopy 28 mm screws were placed distally.  The rotation and length were checked.   The wounds were irrigated with normal saline subcu was closed using Vicryl skin was closed using staples Mepilex dressings were applied.  Attention was then focused on the left upper extremity.  An incision was made over the inferior margin of the clavicle.  Dissection was carried down to the dorsum of the clavicle subperiosteal dissection was used to debride the bone the fracture site was cleansed irrigated and the most medial aspect of the plate was secured with a clamp to the clavicle the clavicle was then reduced to the plate laterally.  2 screws were placed medially and 2 screws laterally to stabilize the clavicle fracture.  C-arm fluoroscopy verified alignment.  The wound was irrigated with normal saline subcu was closed using 0 Vicryl the skin was closed using Steri-Strips.  Mepilex dressing was applied patient was extubated taken the PACU in stable condition.   DISCHARGE PLANNING:  Antibiotic duration: 24 hours antibiotics.  Weightbearing: Nonweightbearing right lower extremity and partial weightbearing left upper extremity.  Pain medication: As needed  Dressing care/ Wound VAC: Dry dressing change as needed.  Ambulatory devices: Patient will need a walker and wheelchair.  Depending on her mobility she may require discharge to skilled nursing.  Discharge to: Home possibly rehab.  Follow-up: In the office 1 week post operative.

## 2017-01-21 NOTE — Anesthesia Postprocedure Evaluation (Signed)
Anesthesia Post Note  Patient: Jessica [name]  Procedure(s) Performed: OPEN REDUCTION INTERNAL FIXATION (ORIF) CLAVICULAR FRACTURE (Left ) INTRAMEDULLARY (IM) NAIL TIBIAL (Right )     Patient location during evaluation: PACU Anesthesia Type: General Level of consciousness: awake and alert Pain management: pain level controlled Vital Signs Assessment: post-procedure vital signs reviewed and stable Respiratory status: spontaneous breathing, nonlabored ventilation, respiratory function stable and patient connected to nasal cannula oxygen Cardiovascular status: blood pressure returned to baseline and stable Postop Assessment: no apparent nausea or vomiting Anesthetic complications: no    Last Vitals:  Vitals:   01/21/17 1323 01/21/17 1417  BP:  125/85  Pulse:  94  Resp:  18  Temp: (!) 36.4 C 36.8 C  SpO2:  100%    Last Pain:  Vitals:   01/21/17 1417  TempSrc: Oral  PainSc:                  Evelio Rueda S

## 2017-01-21 NOTE — ED Notes (Signed)
Dr. Ward at bedside.

## 2017-01-21 NOTE — Anesthesia Preprocedure Evaluation (Signed)
Anesthesia Evaluation  Patient identified by MRN, date of birth, ID band Patient awake    Reviewed: Allergy & Precautions, NPO status , Patient's Chart, lab work & pertinent test results  Airway Mallampati: II  TM Distance: >3 FB Neck ROM: Full    Dental no notable dental hx.    Pulmonary neg pulmonary ROS,    Pulmonary exam normal breath sounds clear to auscultation       Cardiovascular negative cardio ROS Normal cardiovascular exam Rhythm:Regular Rate:Normal     Neuro/Psych negative neurological ROS  negative psych ROS   GI/Hepatic negative GI ROS, Neg liver ROS,   Endo/Other  negative endocrine ROS  Renal/GU negative Renal ROS  negative genitourinary   Musculoskeletal negative musculoskeletal ROS (+)   Abdominal   Peds negative pediatric ROS (+)  Hematology negative hematology ROS (+)   Anesthesia Other Findings   Reproductive/Obstetrics negative OB ROS                             Anesthesia Physical Anesthesia Plan  ASA: II and emergent  Anesthesia Plan: General   Post-op Pain Management:    Induction: Intravenous  PONV Risk Score and Plan: 3 and Ondansetron, Dexamethasone, Midazolam and Treatment may vary due to age or medical condition  Airway Management Planned: Oral ETT  Additional Equipment:   Intra-op Plan:   Post-operative Plan: Extubation in OR  Informed Consent: I have reviewed the patients History and Physical, chart, labs and discussed the procedure including the risks, benefits and alternatives for the proposed anesthesia with the patient or authorized representative who has indicated his/her understanding and acceptance.   Dental advisory given  Plan Discussed with: CRNA and Surgeon  Anesthesia Plan Comments:         Anesthesia Quick Evaluation

## 2017-01-21 NOTE — Progress Notes (Signed)
Patient transported to OR at this time.

## 2017-01-21 NOTE — Progress Notes (Signed)
Called to notify Dr. Lajoyce Cornersuda that patient arrived to unit.

## 2017-01-21 NOTE — Transfer of Care (Signed)
Immediate Anesthesia Transfer of Care Note  Patient: Jessica RooseveltShari Y [name]  Procedure(s) Performed: OPEN REDUCTION INTERNAL FIXATION (ORIF) CLAVICULAR FRACTURE (Left ) INTRAMEDULLARY (IM) NAIL TIBIAL (Right )  Patient Location: PACU  Anesthesia Type:General  Level of Consciousness: awake, alert , oriented and sedated  Airway & Oxygen Therapy: Patient Spontanous Breathing and Patient connected to nasal cannula oxygen  Post-op Assessment: Report given to RN, Post -op Vital signs reviewed and stable and Patient moving all extremities  Post vital signs: Reviewed and stable  Last Vitals:  Vitals:   01/21/17 0547 01/21/17 1218  BP: 121/72   Pulse: 98   Resp: 16   Temp: 37.1 C (!) (P) 36.3 C  SpO2: 100%     Last Pain:  Vitals:   01/21/17 1218  TempSrc:   PainSc: (P) Asleep         Complications: No apparent anesthesia complications

## 2017-01-21 NOTE — Progress Notes (Signed)
Orthopedic Tech Progress Note Patient Details:  Jessica RooseveltShari Y [name] 02-20-1985 [id number]  Ortho Devices Type of Ortho Device: CAM walker, Sling immobilizer Ortho Device/Splint Location: lue/rle Ortho Device/Splint Interventions: Application   Post Interventions Patient Tolerated: Well Instructions Provided: Care of device   Nikki DomCrawford, Lazarius Rivkin 01/21/2017, 1:01 PM

## 2017-01-21 NOTE — Plan of Care (Signed)
  Progressing Education: Knowledge of General Education information will improve 01/21/2017 1250 - Progressing by Darreld Mcleanox, Lachele Lievanos, RN Health Behavior/Discharge Planning: Ability to manage health-related needs will improve 01/21/2017 1250 - Progressing by Darreld Mcleanox, Celest Reitz, RN Clinical Measurements: Ability to maintain clinical measurements within normal limits will improve 01/21/2017 1250 - Progressing by Darreld Mcleanox, Ha Placeres, RN Will remain free from infection 01/21/2017 1250 - Progressing by Darreld Mcleanox, Nayra Coury, RN Diagnostic test results will improve 01/21/2017 1250 - Progressing by Darreld Mcleanox, Shianne Zeiser, RN Respiratory complications will improve 01/21/2017 1250 - Progressing by Darreld Mcleanox, Garik Diamant, RN Cardiovascular complication will be avoided 01/21/2017 1250 - Progressing by Darreld Mcleanox, Melaney Tellefsen, RN Activity: Risk for activity intolerance will decrease 01/21/2017 1250 - Progressing by Darreld Mcleanox, Phoenyx Melka, RN Nutrition: Adequate nutrition will be maintained 01/21/2017 1250 - Progressing by Darreld Mcleanox, Fatin Bachicha, RN Coping: Level of anxiety will decrease 01/21/2017 1250 - Progressing by Darreld Mcleanox, Hendryx Ricke, RN Elimination: Will not experience complications related to bowel motility 01/21/2017 1250 - Progressing by Darreld Mcleanox, Lakeita Panther, RN Will not experience complications related to urinary retention 01/21/2017 1250 - Progressing by Darreld Mcleanox, Maliyah Willets, RN Pain Managment: General experience of comfort will improve 01/21/2017 1250 - Progressing by Darreld Mcleanox, Inga Noller, RN Safety: Ability to remain free from injury will improve 01/21/2017 1250 - Progressing by Darreld Mcleanox, Khylen Riolo, RN Skin Integrity: Risk for impaired skin integrity will decrease 01/21/2017 1250 - Progressing by Darreld Mcleanox, Makyia Erxleben, RN

## 2017-01-21 NOTE — ED Notes (Signed)
Patient transported to X-ray 
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
## 2017-01-21 NOTE — Anesthesia Procedure Notes (Signed)
Procedure Name: Intubation Date/Time: 01/21/2017 10:29 AM Performed by: Fransisca KaufmannMeyer, Sony Schlarb E, CRNA Pre-anesthesia Checklist: Patient identified, Emergency Drugs available, Suction available and Patient being monitored Patient Re-evaluated:Patient Re-evaluated prior to induction Oxygen Delivery Method: Circle System Utilized Preoxygenation: Pre-oxygenation with 100% oxygen Induction Type: IV induction Ventilation: Mask ventilation without difficulty Laryngoscope Size: Miller and 2 Grade View: Grade I Tube type: Oral Tube size: 7.0 mm Number of attempts: 1 Airway Equipment and Method: Stylet and Oral airway Placement Confirmation: ETT inserted through vocal cords under direct vision,  positive ETCO2 and breath sounds checked- equal and bilateral Secured at: 23 cm Tube secured with: Tape Dental Injury: Teeth and Oropharynx as per pre-operative assessment

## 2017-01-21 NOTE — Progress Notes (Signed)
Patient returns from OR at this time 

## 2017-01-22 ENCOUNTER — Encounter (HOSPITAL_COMMUNITY): Payer: Self-pay | Admitting: Orthopedic Surgery

## 2017-01-22 NOTE — Progress Notes (Signed)
Patient ID: Jessica [name], female   DOB: 06-26-1985, 32 y.o.   MRN: [id number] Postoperative day 1 status post IM nailing for the right tibia segmental fracture and open reduction internal fixation left clavicle fracture.  Patient is comfortable this morning both upper extremities and lower extremities are neurovascular intact.  Plan for physical therapy minimizing weightbearing on the right lower extremity for transfers and minimizing weightbearing on the left upper extremity.  Anticipate discharge to home once she is safe with therapy.
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
## 2017-01-23 NOTE — Progress Notes (Signed)
Patient ID: Jessica [name], female   DOB: 07/24/1985, 32 y.o.   MRN: [id number] Postoperative day 2 status post intramedullary nailing for closed right segmental tibia fracture, ORIF clavicle fracture on the left.  Patient shows good improvement with mobility with therapy yesterday.  Anticipate if she continues to improve she could be discharged to home tomorrow.
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
## 2017-01-24 MED ORDER — OXYCODONE-ACETAMINOPHEN 5-325 MG PO TABS: 1.0000 | ORAL_TABLET | ORAL | 0 refills | Status: AC | PRN
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
## 2017-01-24 NOTE — Plan of Care (Signed)
  Adequate for Discharge Education: Knowledge of General Education information will improve 01/24/2017 1029 - Adequate for Discharge by Darreld Mcleanox, Iyannah Blake, RN Health Behavior/Discharge Planning: Ability to manage health-related needs will improve 01/24/2017 1029 - Adequate for Discharge by Darreld Mcleanox, Erving Sassano, RN Clinical Measurements: Ability to maintain clinical measurements within normal limits will improve 01/24/2017 1029 - Adequate for Discharge by Darreld Mcleanox, Colleen Kotlarz, RN Will remain free from infection 01/24/2017 1029 - Adequate for Discharge by Darreld Mcleanox, Rajah Lamba, RN Diagnostic test results will improve 01/24/2017 1029 - Adequate for Discharge by Darreld Mcleanox, Graciana Sessa, RN Respiratory complications will improve 01/24/2017 1029 - Adequate for Discharge by Darreld Mcleanox, Claretta Kendra, RN Cardiovascular complication will be avoided 01/24/2017 1029 - Adequate for Discharge by Darreld Mcleanox, Maki Hege, RN Activity: Risk for activity intolerance will decrease 01/24/2017 1029 - Adequate for Discharge by Darreld Mcleanox, Emmalea Treanor, RN Nutrition: Adequate nutrition will be maintained 01/24/2017 1029 - Adequate for Discharge by Darreld Mcleanox, Kenady Doxtater, RN Coping: Level of anxiety will decrease 01/24/2017 1029 - Adequate for Discharge by Darreld Mcleanox, Maleeka Sabatino, RN Elimination: Will not experience complications related to bowel motility 01/24/2017 1029 - Adequate for Discharge by Darreld Mcleanox, Nysa Sarin, RN Will not experience complications related to urinary retention 01/24/2017 1029 - Adequate for Discharge by Darreld Mcleanox, Isreal Moline, RN Pain Managment: General experience of comfort will improve 01/24/2017 1029 - Adequate for Discharge by Darreld Mcleanox, Brennyn Ortlieb, RN Safety: Ability to remain free from injury will improve 01/24/2017 1029 - Adequate for Discharge by Darreld Mcleanox, Jessiah Wojnar, RN Skin Integrity: Risk for impaired skin integrity will decrease 01/24/2017 1029 - Adequate for Discharge by Darreld Mcleanox, Miquel Lamson, RN
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
## 2017-01-27 ENCOUNTER — Telehealth (INDEPENDENT_AMBULATORY_CARE_PROVIDER_SITE_OTHER): Payer: Self-pay | Admitting: Orthopedic Surgery

## 2017-01-27 NOTE — Telephone Encounter (Signed)
Jessica [name]-)PT( with [facility] called advised she did eval only. Patient refused (OT) Jessica [name] advised patient was discharged as of Saturday. The nu ber to contact Jessica [name] is [phone]

## 2017-01-27 NOTE — Telephone Encounter (Signed)
Pt s/p surgery and has refused therapy. Dr. Lajoyce Cornersuda reviewed and is aware.

## 2017-02-03 ENCOUNTER — Ambulatory Visit (INDEPENDENT_AMBULATORY_CARE_PROVIDER_SITE_OTHER): Payer: Commercial Managed Care - PPO | Admitting: Orthopedic Surgery

## 2017-02-03 ENCOUNTER — Ambulatory Visit (INDEPENDENT_AMBULATORY_CARE_PROVIDER_SITE_OTHER): Payer: Commercial Managed Care - PPO

## 2017-02-03 ENCOUNTER — Encounter (INDEPENDENT_AMBULATORY_CARE_PROVIDER_SITE_OTHER): Payer: Self-pay | Admitting: Orthopedic Surgery

## 2017-02-03 VITALS — Ht 63.0 in | Wt 184.0 lb

## 2017-02-03 DIAGNOSIS — S82201S Unspecified fracture of shaft of right tibia, sequela: Secondary | ICD-10-CM

## 2017-02-03 DIAGNOSIS — S82401S Unspecified fracture of shaft of right fibula, sequela: Secondary | ICD-10-CM

## 2017-02-03 DIAGNOSIS — M898X1 Other specified disorders of bone, shoulder: Secondary | ICD-10-CM

## 2017-02-03 DIAGNOSIS — M898X6 Other specified disorders of bone, lower leg: Secondary | ICD-10-CM

## 2017-02-03 DIAGNOSIS — S82251D Displaced comminuted fracture of shaft of right tibia, subsequent encounter for closed fracture with routine healing: Secondary | ICD-10-CM | POA: Diagnosis not present

## 2017-02-03 DIAGNOSIS — S42022D Displaced fracture of shaft of left clavicle, subsequent encounter for fracture with routine healing: Secondary | ICD-10-CM | POA: Diagnosis not present
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
## 2017-02-18 ENCOUNTER — Encounter (INDEPENDENT_AMBULATORY_CARE_PROVIDER_SITE_OTHER): Payer: Self-pay | Admitting: Orthopedic Surgery

## 2017-02-18 ENCOUNTER — Ambulatory Visit (INDEPENDENT_AMBULATORY_CARE_PROVIDER_SITE_OTHER): Payer: Commercial Managed Care - PPO | Admitting: Orthopedic Surgery

## 2017-02-18 VITALS — Ht 63.0 in | Wt 184.0 lb

## 2017-02-18 DIAGNOSIS — S42022D Displaced fracture of shaft of left clavicle, subsequent encounter for fracture with routine healing: Secondary | ICD-10-CM

## 2017-02-18 DIAGNOSIS — S82201S Unspecified fracture of shaft of right tibia, sequela: Secondary | ICD-10-CM

## 2017-02-18 DIAGNOSIS — S82401S Unspecified fracture of shaft of right fibula, sequela: Secondary | ICD-10-CM
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
## 2017-03-03 ENCOUNTER — Telehealth (INDEPENDENT_AMBULATORY_CARE_PROVIDER_SITE_OTHER): Payer: Self-pay | Admitting: Orthopedic Surgery

## 2017-03-03 NOTE — Telephone Encounter (Signed)
Patient stated there were papers faxed last week regarding her disability. She wanted to know if they were received, please advise # [phone]

## 2017-03-04 NOTE — Telephone Encounter (Signed)
Jessica [name] have you seen anything come through on this pt?

## 2017-03-05 NOTE — Telephone Encounter (Signed)
Do you have disability forms for this pt? Can you please call her and let her know the status?

## 2017-03-05 NOTE — Telephone Encounter (Signed)
I don't recall specifically, but if received them, they would have been sent for [employer].

## 2017-03-06 NOTE — Telephone Encounter (Signed)
I do, I spoke to her yesterday about them.

## 2017-03-20 ENCOUNTER — Encounter (INDEPENDENT_AMBULATORY_CARE_PROVIDER_SITE_OTHER): Payer: Self-pay | Admitting: Orthopedic Surgery

## 2017-03-20 ENCOUNTER — Ambulatory Visit (INDEPENDENT_AMBULATORY_CARE_PROVIDER_SITE_OTHER): Payer: Commercial Managed Care - PPO | Admitting: Orthopedic Surgery

## 2017-03-20 ENCOUNTER — Ambulatory Visit (INDEPENDENT_AMBULATORY_CARE_PROVIDER_SITE_OTHER): Payer: Commercial Managed Care - PPO

## 2017-03-20 VITALS — Ht 63.0 in | Wt 184.0 lb

## 2017-03-20 DIAGNOSIS — M898X1 Other specified disorders of bone, shoulder: Secondary | ICD-10-CM

## 2017-03-20 DIAGNOSIS — M898X6 Other specified disorders of bone, lower leg: Secondary | ICD-10-CM
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
## 2017-03-26 ENCOUNTER — Telehealth (INDEPENDENT_AMBULATORY_CARE_PROVIDER_SITE_OTHER): Payer: Self-pay | Admitting: Orthopedic Surgery

## 2017-03-26 NOTE — Telephone Encounter (Signed)
Patient called saying that a return to work form was given to Dr. Lajoyce Cornersuda to fill out and she would like a return phone call whenever finished and ready to be picked up. CB # [phone]

## 2017-03-26 NOTE — Telephone Encounter (Signed)
I called and sw pt to advise that the form had been completed already and that we advised out of work until her appt on 04/17/17 will eval at that time and advise of return to work status at that date.

## 2017-04-17 ENCOUNTER — Encounter (INDEPENDENT_AMBULATORY_CARE_PROVIDER_SITE_OTHER): Payer: Self-pay | Admitting: Orthopedic Surgery

## 2017-04-17 ENCOUNTER — Ambulatory Visit (INDEPENDENT_AMBULATORY_CARE_PROVIDER_SITE_OTHER): Payer: Commercial Managed Care - PPO

## 2017-04-17 ENCOUNTER — Ambulatory Visit (INDEPENDENT_AMBULATORY_CARE_PROVIDER_SITE_OTHER): Payer: Commercial Managed Care - PPO | Admitting: Orthopedic Surgery

## 2017-04-17 VITALS — Ht 63.0 in | Wt 184.0 lb

## 2017-04-17 DIAGNOSIS — M898X6 Other specified disorders of bone, lower leg: Secondary | ICD-10-CM

## 2017-04-17 DIAGNOSIS — S82251D Displaced comminuted fracture of shaft of right tibia, subsequent encounter for closed fracture with routine healing: Secondary | ICD-10-CM | POA: Diagnosis not present

## 2017-04-17 DIAGNOSIS — M898X1 Other specified disorders of bone, shoulder: Secondary | ICD-10-CM

## 2017-04-17 DIAGNOSIS — S82401S Unspecified fracture of shaft of right fibula, sequela: Secondary | ICD-10-CM

## 2017-04-17 DIAGNOSIS — S82201S Unspecified fracture of shaft of right tibia, sequela: Secondary | ICD-10-CM

## 2017-04-17 DIAGNOSIS — S42022D Displaced fracture of shaft of left clavicle, subsequent encounter for fracture with routine healing: Secondary | ICD-10-CM
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
## 2017-05-20 ENCOUNTER — Ambulatory Visit (INDEPENDENT_AMBULATORY_CARE_PROVIDER_SITE_OTHER): Payer: Commercial Managed Care - PPO | Admitting: Orthopedic Surgery

## 2017-05-20 ENCOUNTER — Encounter (INDEPENDENT_AMBULATORY_CARE_PROVIDER_SITE_OTHER): Payer: Self-pay | Admitting: Orthopedic Surgery

## 2017-05-20 DIAGNOSIS — M25512 Pain in left shoulder: Secondary | ICD-10-CM | POA: Diagnosis not present

## 2017-05-20 DIAGNOSIS — S42022D Displaced fracture of shaft of left clavicle, subsequent encounter for fracture with routine healing: Secondary | ICD-10-CM | POA: Diagnosis not present

## 2017-05-20 DIAGNOSIS — S82201S Unspecified fracture of shaft of right tibia, sequela: Secondary | ICD-10-CM

## 2017-05-20 DIAGNOSIS — M79604 Pain in right leg: Secondary | ICD-10-CM | POA: Diagnosis not present

## 2017-05-20 DIAGNOSIS — S82401S Unspecified fracture of shaft of right fibula, sequela: Secondary | ICD-10-CM
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
## 2020-11-16 ENCOUNTER — Ambulatory Visit: Payer: Self-pay

## 2021-06-12 ENCOUNTER — Other Ambulatory Visit: Payer: Self-pay | Admitting: Obstetrics and Gynecology

## 2021-06-12 DIAGNOSIS — Z1231 Encounter for screening mammogram for malignant neoplasm of breast: Secondary | ICD-10-CM

## 2021-06-25 ENCOUNTER — Ambulatory Visit
Admission: RE | Admit: 2021-06-25 | Discharge: 2021-06-25 | Disposition: A | Discharge status: Home / Self Care | Payer: Commercial Managed Care - PPO | Source: Ambulatory Visit | Attending: Obstetrics and Gynecology | Admitting: Obstetrics and Gynecology

## 2021-06-25 DIAGNOSIS — Z1231 Encounter for screening mammogram for malignant neoplasm of breast: Secondary | ICD-10-CM | POA: Insufficient documentation

## 2021-07-04 ENCOUNTER — Other Ambulatory Visit: Payer: Self-pay | Admitting: Obstetrics and Gynecology

## 2021-07-04 DIAGNOSIS — R928 Other abnormal and inconclusive findings on diagnostic imaging of breast: Secondary | ICD-10-CM

## 2021-07-04 DIAGNOSIS — N63 Unspecified lump in unspecified breast: Secondary | ICD-10-CM

## 2021-07-05 ENCOUNTER — Ambulatory Visit
Admission: RE | Admit: 2021-07-05 | Discharge: 2021-07-05 | Disposition: A | Discharge status: Home / Self Care | Payer: Commercial Managed Care - PPO | Source: Ambulatory Visit | Attending: Obstetrics and Gynecology | Admitting: Obstetrics and Gynecology

## 2021-07-05 DIAGNOSIS — N63 Unspecified lump in unspecified breast: Secondary | ICD-10-CM | POA: Diagnosis present

## 2021-07-05 DIAGNOSIS — R928 Other abnormal and inconclusive findings on diagnostic imaging of breast: Secondary | ICD-10-CM | POA: Diagnosis not present

## 2023-08-21 ENCOUNTER — Other Ambulatory Visit: Payer: Self-pay | Admitting: Certified Nurse Midwife

## 2023-08-21 DIAGNOSIS — Z1231 Encounter for screening mammogram for malignant neoplasm of breast: Secondary | ICD-10-CM

## 2023-09-09 ENCOUNTER — Other Ambulatory Visit: Payer: Self-pay | Admitting: Certified Nurse Midwife

## 2023-09-09 DIAGNOSIS — N6321 Unspecified lump in the left breast, upper outer quadrant: Secondary | ICD-10-CM

## 2023-09-11 ENCOUNTER — Other Ambulatory Visit: Payer: Self-pay

## 2023-09-11 ENCOUNTER — Other Ambulatory Visit: Payer: Self-pay | Admitting: Obstetrics and Gynecology

## 2023-09-11 ENCOUNTER — Ambulatory Visit
Admission: RE | Admit: 2023-09-11 | Discharge: 2023-09-11 | Disposition: A | Discharge status: Home / Self Care | Source: Ambulatory Visit | Attending: Certified Nurse Midwife | Admitting: Certified Nurse Midwife

## 2023-09-11 ENCOUNTER — Inpatient Hospital Stay
Admission: RE | Admit: 2023-09-11 | Discharge: 2023-09-11 | Discharge status: Home / Self Care | Source: Ambulatory Visit | Attending: Certified Nurse Midwife | Admitting: Certified Nurse Midwife

## 2023-09-11 ENCOUNTER — Other Ambulatory Visit: Payer: Self-pay | Admitting: Certified Nurse Midwife

## 2023-09-11 DIAGNOSIS — N6311 Unspecified lump in the right breast, upper outer quadrant: Secondary | ICD-10-CM | POA: Insufficient documentation

## 2023-09-11 DIAGNOSIS — N6321 Unspecified lump in the left breast, upper outer quadrant: Secondary | ICD-10-CM

## 2023-09-11 DIAGNOSIS — N6310 Unspecified lump in the right breast, unspecified quadrant: Secondary | ICD-10-CM

## 2024-02-24 ENCOUNTER — Ambulatory Visit: Payer: Self-pay

## 2024-03-15 IMAGING — MG MM DIGITAL SCREENING BILAT W/ TOMO AND CAD
8 series · 8 of 24 positions shown · non-contrast
Comparison: None

CLINICAL DATA: Screening.

EXAM:
DIGITAL SCREENING BILATERAL MAMMOGRAM WITH TOMOSYNTHESIS AND CAD
TECHNIQUE: Bilateral screening digital craniocaudal and mediolateral oblique
mammograms were obtained. Bilateral screening digital breast
tomosynthesis was performed. The images were evaluated with
computer-aided detection.

[L MLO synth-2D]
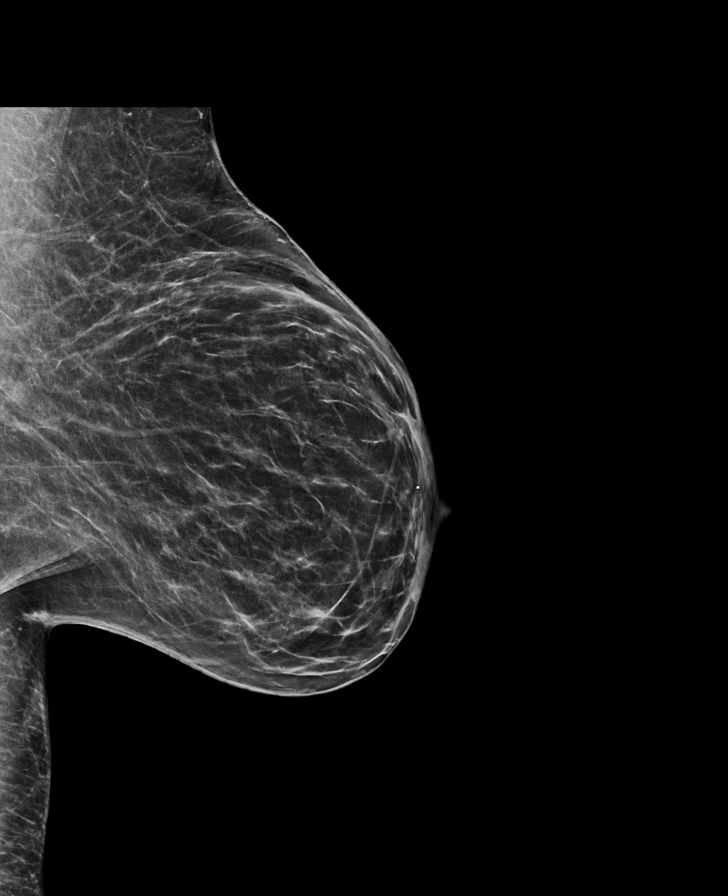

[R CC synth-2D]
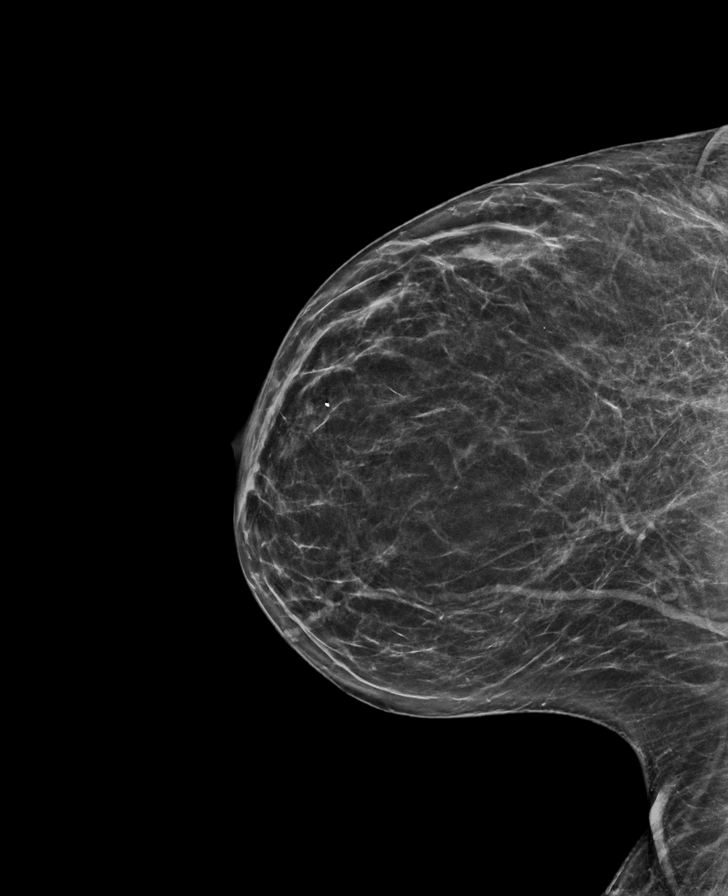

[L CC synth-2D]
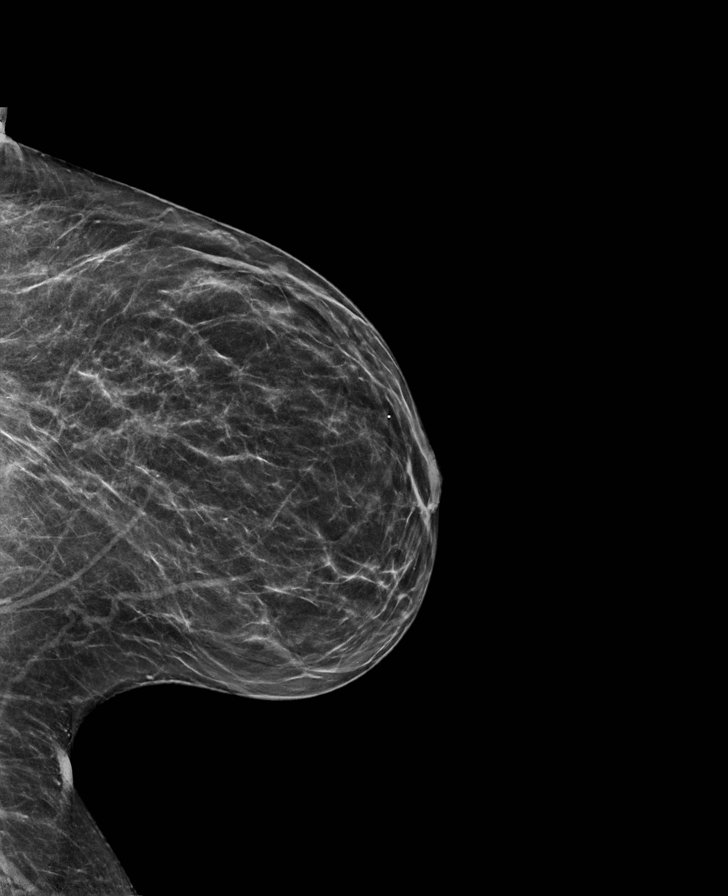

[R MLO synth-2D]
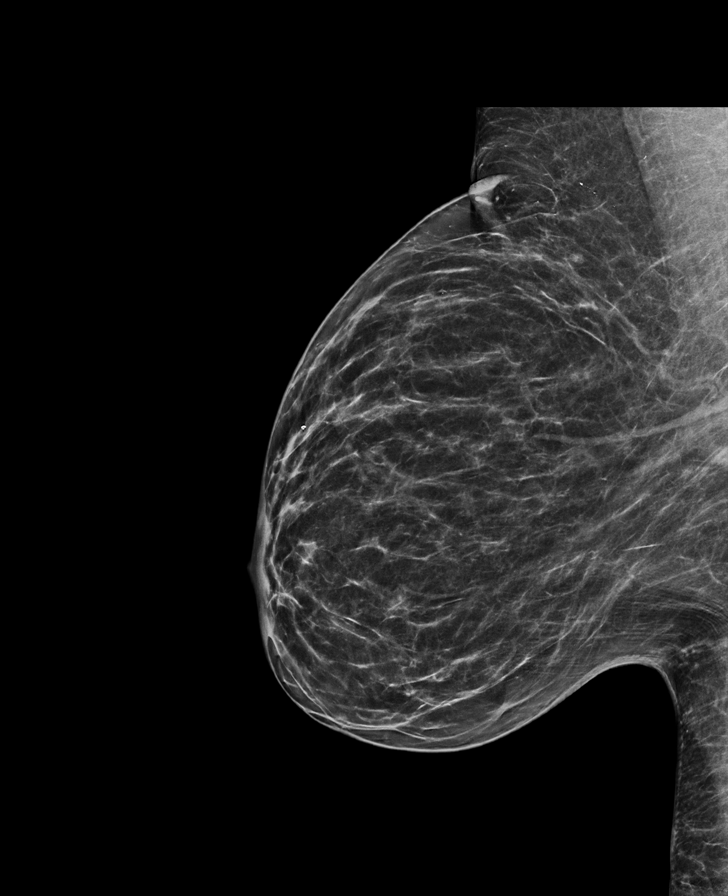

[R CC tomo · tomo slice 32/63.0]
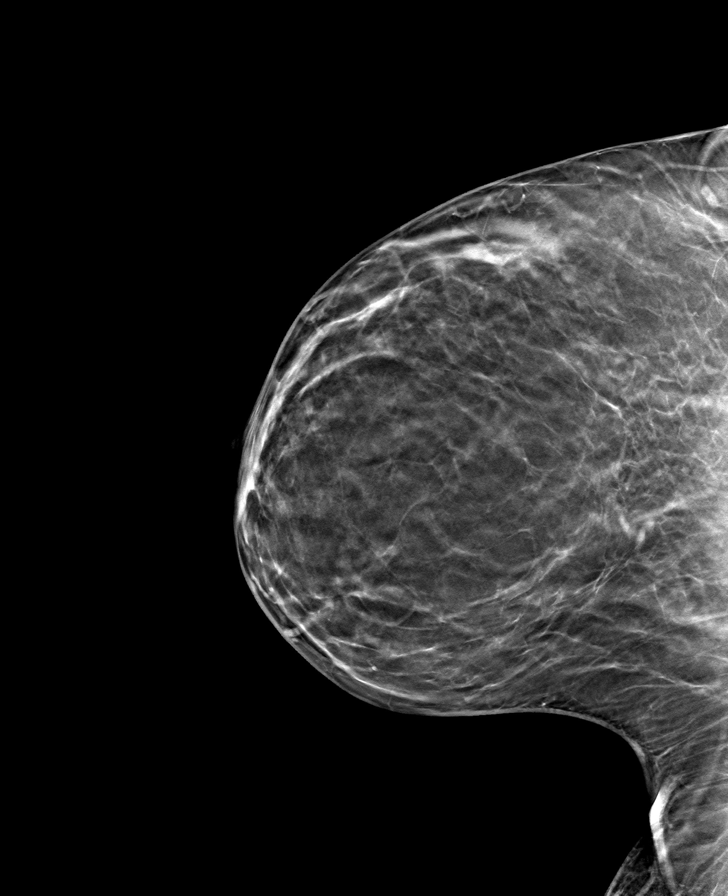

[L MLO tomo · tomo slice 34/67.0]
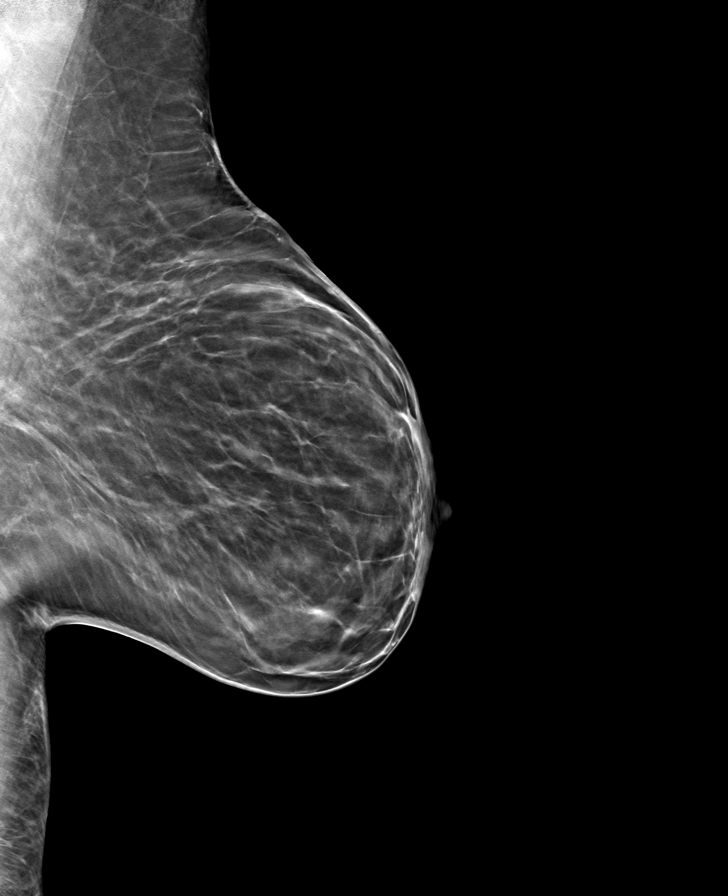

[L CC tomo · tomo slice 32/63.0]
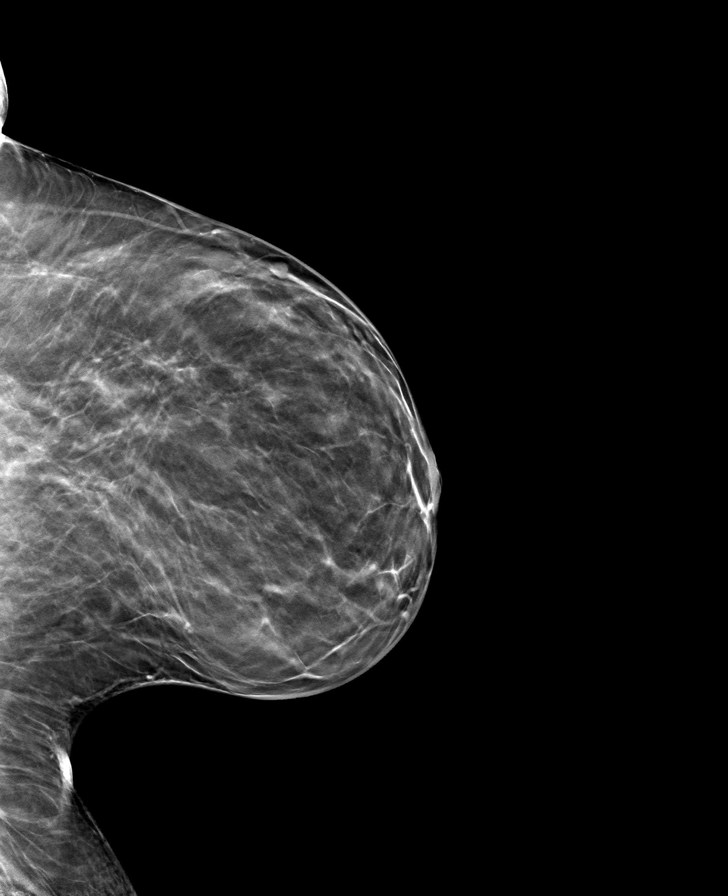

[R MLO tomo · tomo slice 35/69.0]
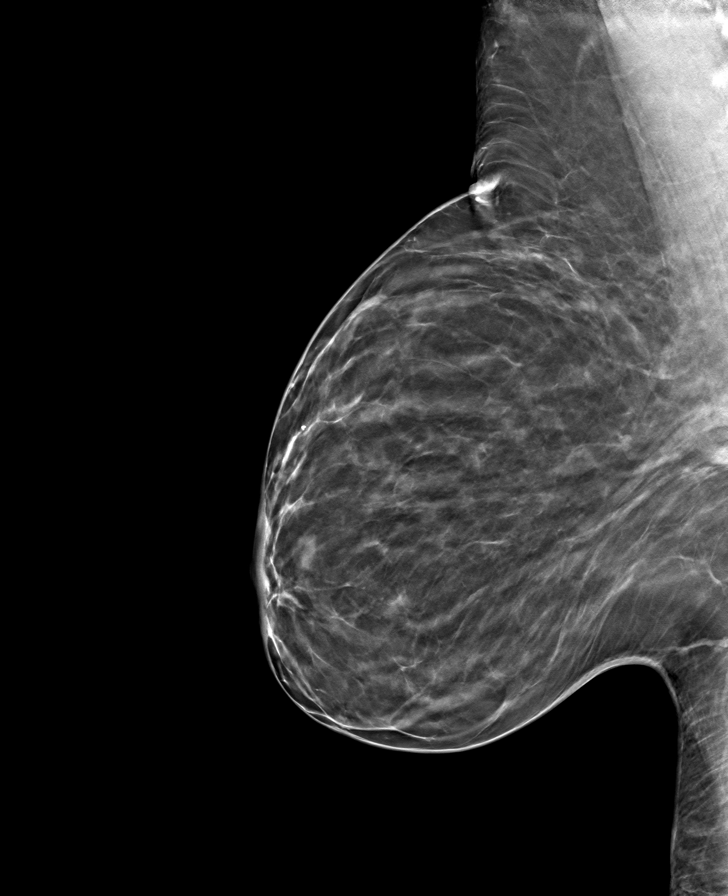

[8 of 24 positions shown; findings below may reference images not displayed]

ACR Breast Density Category b: There are scattered areas of
fibroglandular density.
FINDINGS: In the left breast, a possible mass warrants further evaluation. In
the right breast, no findings suspicious for malignancy.
IMPRESSION: Further evaluation is suggested for possible mass in the left
breast.

RECOMMENDATION:
Ultrasound of the left breast. (Code:[id number])

The patient will be contacted regarding the findings, and additional
imaging will be scheduled.

BI-RADS CATEGORY  0: Incomplete. Need additional imaging evaluation
and/or prior mammograms for comparison.

## 2024-03-25 IMAGING — US US BREAST*L* LIMITED INC AXILLA
1 series · 5 of 5 positions shown · non-contrast
Comparison: None Available.

CLINICAL DATA: Patient recalled from screening for left breast
mass.

EXAM:
ULTRASOUND OF THE LEFT BREAST

[Series 1: [facility] left inc trans 20 · 5 of 5 slices shown]
[im 1/5]
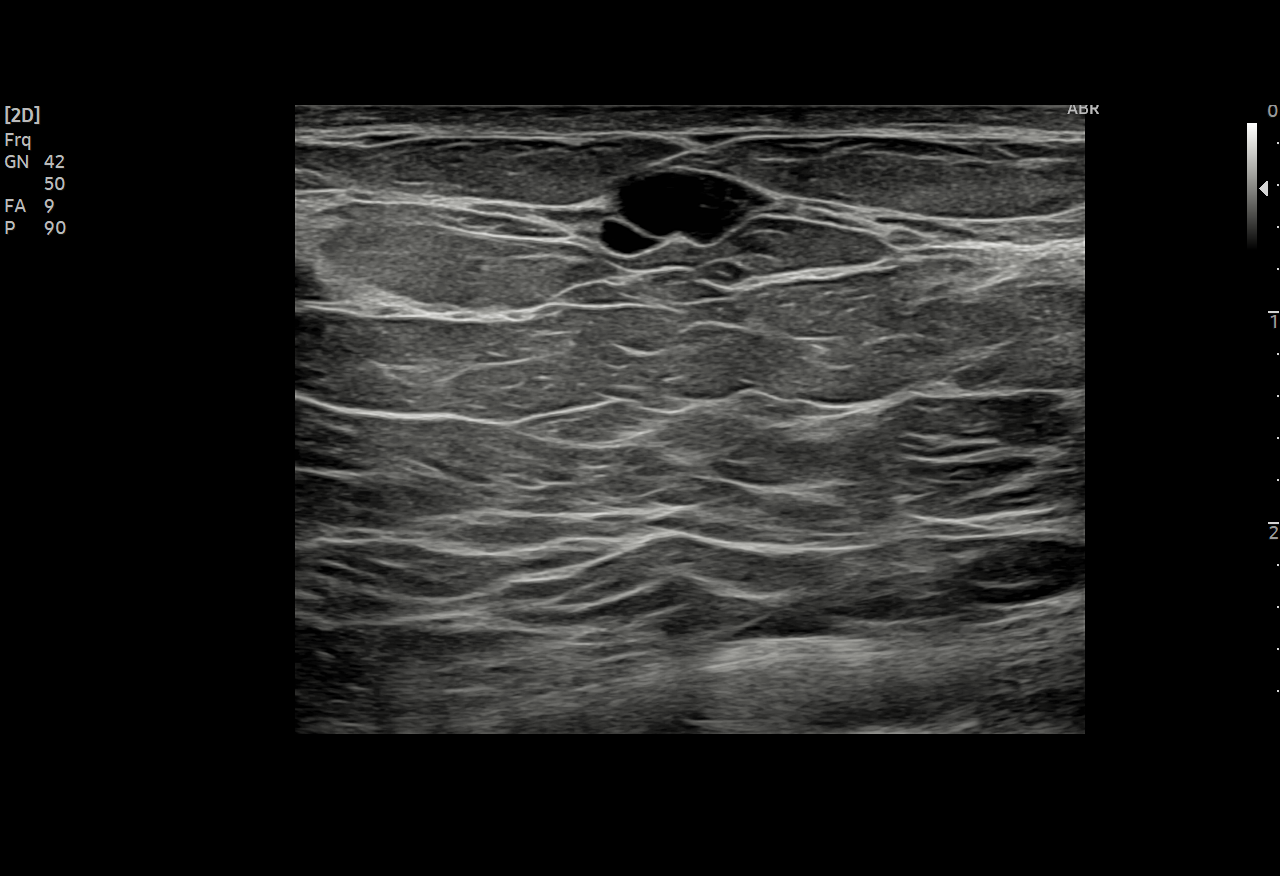
[im 2/5]
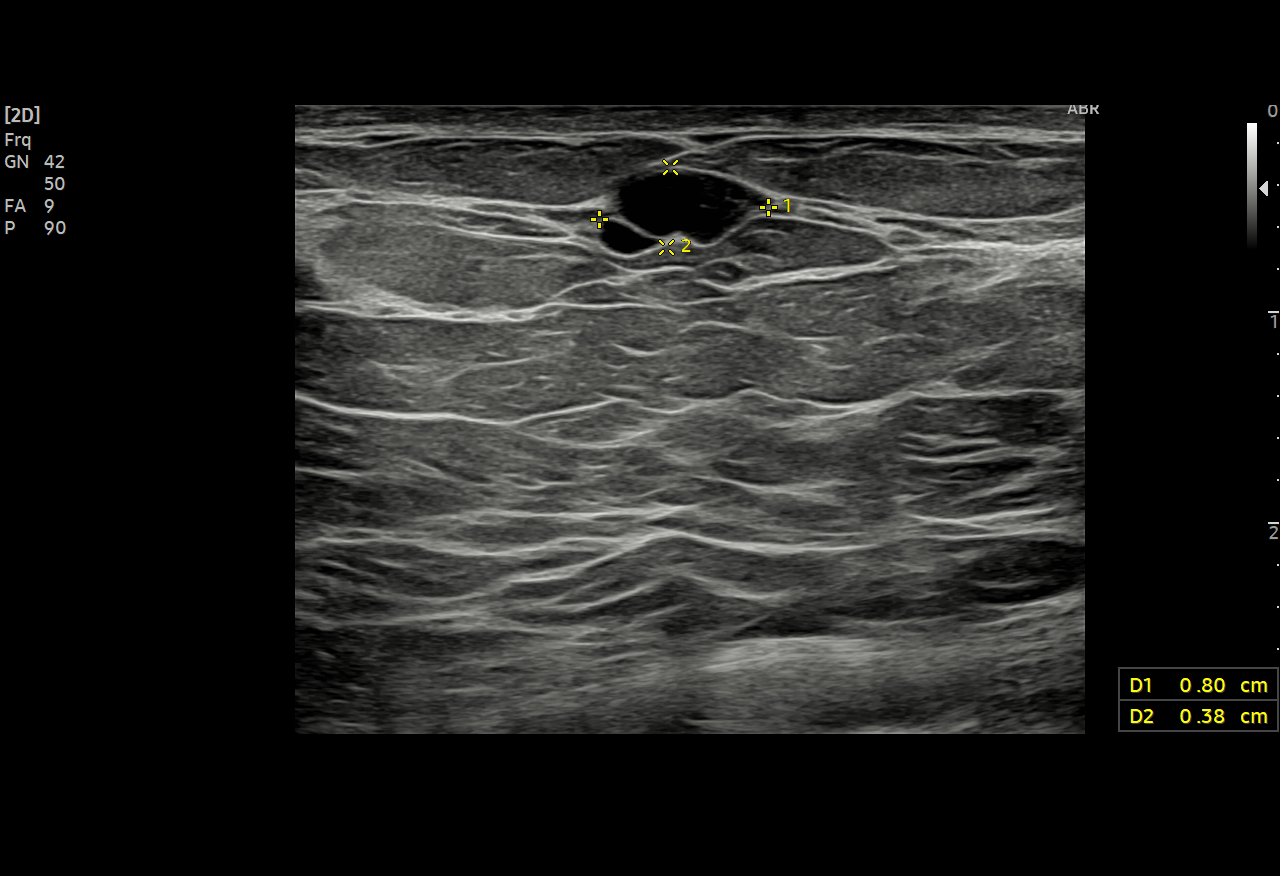
[im 3/5]
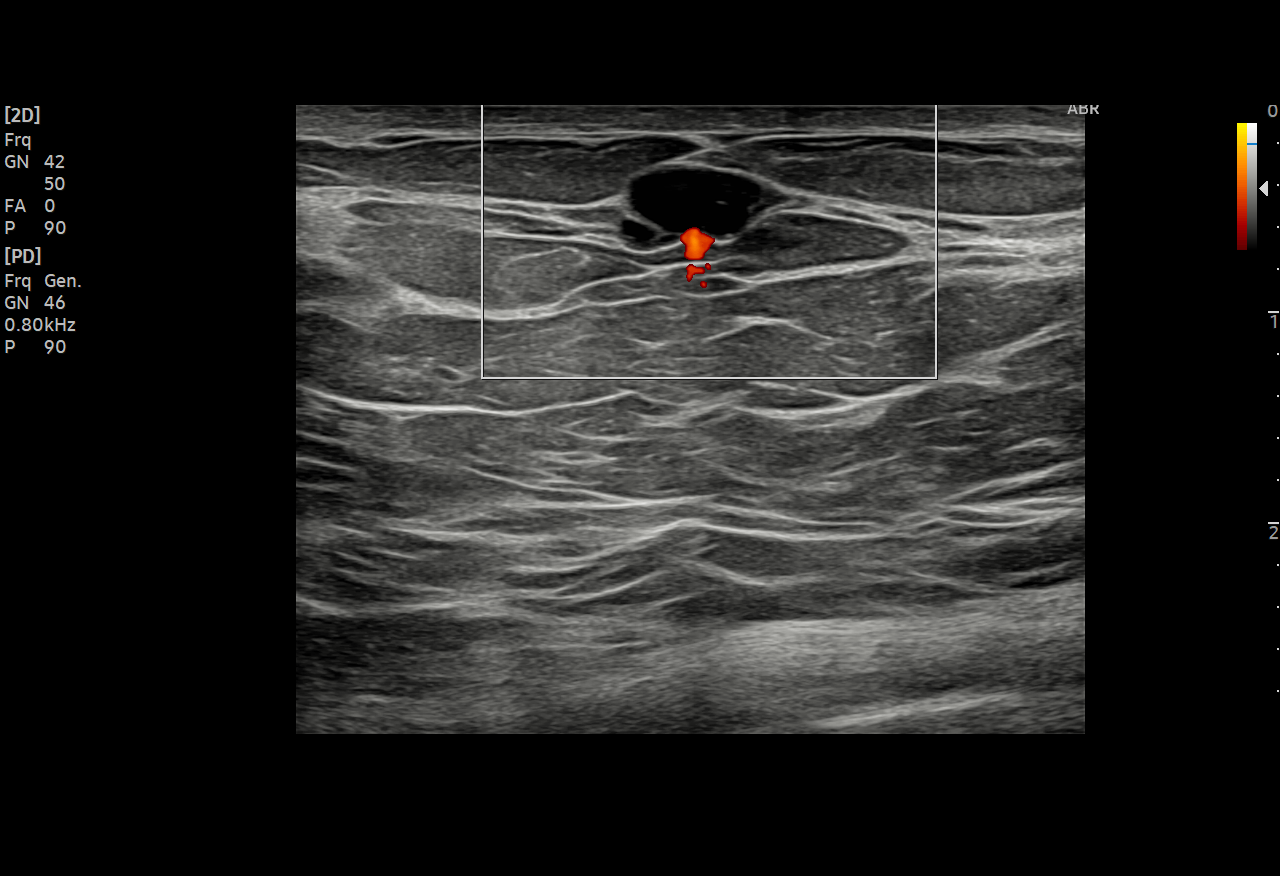
[im 4/5]
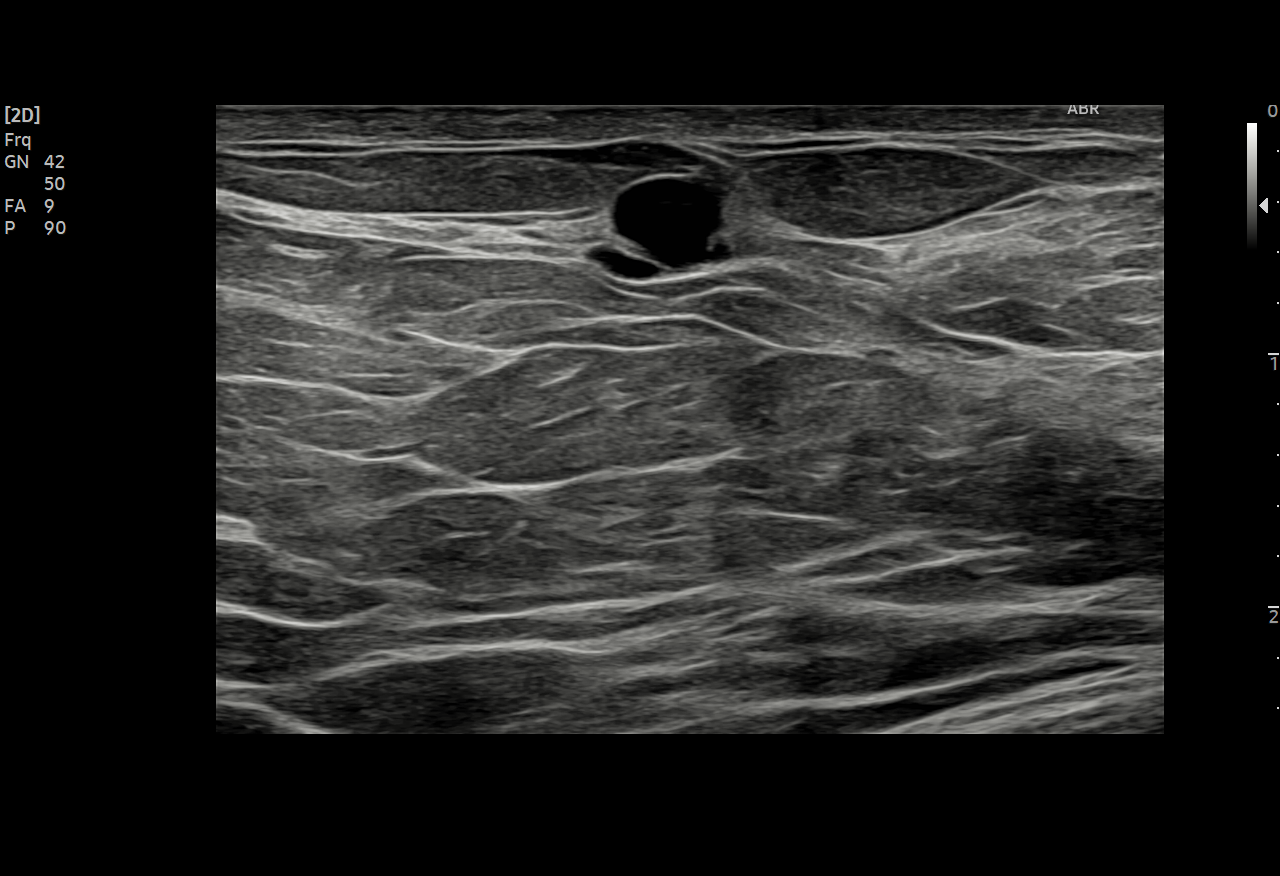
[im 5/5]
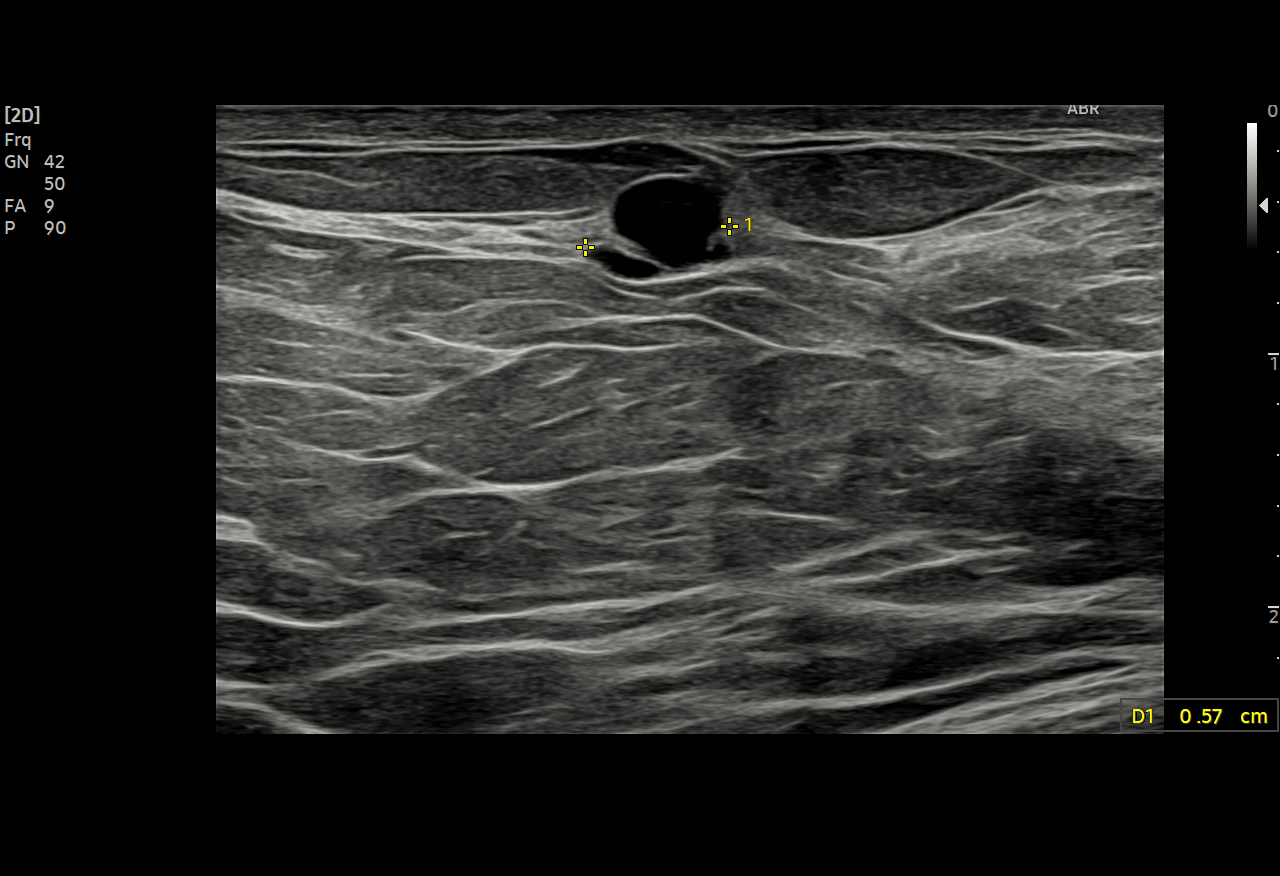

[5 of 5 positions shown; findings below may reference images not displayed]

FINDINGS: Targeted ultrasound is performed, showing a 4 x 8 x 6 mm cyst left
breast 1 o'clock position 3 cm from the nipple.
IMPRESSION: Left breast cyst.

RECOMMENDATION:
Screening mammogram in one year.(Code:4H-3-44C)

I have discussed the findings and recommendations with the patient.
If applicable, a reminder letter will be sent to the patient
regarding the next appointment.

BI-RADS CATEGORY  2: Benign.
